# Patient Record
Sex: Female | Born: 1980 | Hispanic: Yes | Marital: Single | Smoking: Never smoker
Health system: Southern US, Community
[De-identification: ages and names within clinical notes are randomized; demographics above are authoritative.]

## PROBLEM LIST (undated history)

## (undated) HISTORY — PX: NOSE SURGERY: SHX723

---

## 2006-07-14 ENCOUNTER — Emergency Department (HOSPITAL_COMMUNITY): Admission: EM | Admit: 2006-07-14 | Discharge: 2006-07-14 | Payer: Self-pay | Admitting: Family Medicine

## 2006-08-13 ENCOUNTER — Encounter (INDEPENDENT_AMBULATORY_CARE_PROVIDER_SITE_OTHER): Payer: Self-pay | Admitting: Family Medicine

## 2006-08-13 ENCOUNTER — Ambulatory Visit: Payer: Self-pay | Admitting: Family Medicine

## 2006-08-13 LAB — CONVERTED CEMR LAB
Basophils Relative: 0 % (ref 0–1)
Eosinophils Absolute: 0.3 10*3/uL (ref 0.0–0.7)
Hepatitis B Surface Ag: NEGATIVE
MCHC: 34.2 g/dL (ref 30.0–36.0)
MCV: 88.8 fL (ref 78.0–100.0)
Neutrophils Relative %: 69 % (ref 43–77)
Platelets: 308 10*3/uL (ref 150–400)

## 2006-08-14 ENCOUNTER — Encounter (INDEPENDENT_AMBULATORY_CARE_PROVIDER_SITE_OTHER): Payer: Self-pay | Admitting: Family Medicine

## 2006-08-14 LAB — CONVERTED CEMR LAB

## 2006-08-20 ENCOUNTER — Encounter: Payer: Self-pay | Admitting: *Deleted

## 2006-08-20 ENCOUNTER — Encounter (INDEPENDENT_AMBULATORY_CARE_PROVIDER_SITE_OTHER): Payer: Self-pay | Admitting: Family Medicine

## 2006-08-20 ENCOUNTER — Encounter (INDEPENDENT_AMBULATORY_CARE_PROVIDER_SITE_OTHER): Payer: Self-pay | Admitting: *Deleted

## 2006-08-20 ENCOUNTER — Ambulatory Visit: Payer: Self-pay | Admitting: Family Medicine

## 2006-08-20 ENCOUNTER — Encounter: Payer: Self-pay | Admitting: Family Medicine

## 2006-08-20 DIAGNOSIS — O99891 Other specified diseases and conditions complicating pregnancy: Secondary | ICD-10-CM | POA: Insufficient documentation

## 2006-08-20 DIAGNOSIS — R8271 Bacteriuria: Secondary | ICD-10-CM

## 2006-08-20 DIAGNOSIS — O9989 Other specified diseases and conditions complicating pregnancy, childbirth and the puerperium: Secondary | ICD-10-CM

## 2006-08-20 DIAGNOSIS — R87610 Atypical squamous cells of undetermined significance on cytologic smear of cervix (ASC-US): Secondary | ICD-10-CM

## 2006-08-20 LAB — CONVERTED CEMR LAB
Chlamydia, DNA Probe: NEGATIVE
GC Probe Amp, Genital: NEGATIVE
Glucose, Urine, Semiquant: NEGATIVE

## 2006-08-21 ENCOUNTER — Encounter (INDEPENDENT_AMBULATORY_CARE_PROVIDER_SITE_OTHER): Payer: Self-pay | Admitting: Family Medicine

## 2006-09-02 ENCOUNTER — Ambulatory Visit: Payer: Self-pay | Admitting: Family Medicine

## 2006-09-02 ENCOUNTER — Encounter: Payer: Self-pay | Admitting: Family Medicine

## 2006-09-10 ENCOUNTER — Ambulatory Visit (HOSPITAL_COMMUNITY): Admission: RE | Admit: 2006-09-10 | Discharge: 2006-09-10 | Payer: Self-pay | Admitting: Family Medicine

## 2006-09-12 ENCOUNTER — Encounter (INDEPENDENT_AMBULATORY_CARE_PROVIDER_SITE_OTHER): Payer: Self-pay | Admitting: Family Medicine

## 2006-10-09 ENCOUNTER — Ambulatory Visit: Payer: Self-pay | Admitting: Family Medicine

## 2006-10-09 ENCOUNTER — Encounter: Payer: Self-pay | Admitting: Family Medicine

## 2006-10-09 DIAGNOSIS — K047 Periapical abscess without sinus: Secondary | ICD-10-CM

## 2006-10-09 LAB — CONVERTED CEMR LAB: Glucose, Urine, Semiquant: NEGATIVE

## 2006-11-10 ENCOUNTER — Encounter: Payer: Self-pay | Admitting: Family Medicine

## 2006-11-10 ENCOUNTER — Ambulatory Visit: Payer: Self-pay | Admitting: Family Medicine

## 2006-11-10 DIAGNOSIS — R7309 Other abnormal glucose: Secondary | ICD-10-CM

## 2006-11-10 LAB — CONVERTED CEMR LAB
Glucose, Urine, Semiquant: NEGATIVE
Protein, U semiquant: NEGATIVE

## 2006-11-14 ENCOUNTER — Ambulatory Visit: Payer: Self-pay | Admitting: Internal Medicine

## 2006-11-14 ENCOUNTER — Encounter (INDEPENDENT_AMBULATORY_CARE_PROVIDER_SITE_OTHER): Payer: Self-pay | Admitting: Family Medicine

## 2006-11-25 ENCOUNTER — Encounter: Payer: Self-pay | Admitting: Family Medicine

## 2006-11-25 ENCOUNTER — Ambulatory Visit: Payer: Self-pay | Admitting: Family Medicine

## 2006-11-25 LAB — CONVERTED CEMR LAB
Glucose, Urine, Semiquant: NEGATIVE
Protein, U semiquant: NEGATIVE

## 2006-12-15 ENCOUNTER — Ambulatory Visit: Payer: Self-pay | Admitting: Family Medicine

## 2006-12-15 ENCOUNTER — Encounter: Payer: Self-pay | Admitting: Family Medicine

## 2006-12-16 ENCOUNTER — Encounter (INDEPENDENT_AMBULATORY_CARE_PROVIDER_SITE_OTHER): Payer: Self-pay | Admitting: Family Medicine

## 2007-01-06 ENCOUNTER — Encounter: Payer: Self-pay | Admitting: Family Medicine

## 2007-01-06 ENCOUNTER — Ambulatory Visit: Payer: Self-pay | Admitting: Family Medicine

## 2007-01-06 LAB — CONVERTED CEMR LAB
Chlamydia, DNA Probe: NEGATIVE
GC Probe Amp, Genital: NEGATIVE
Glucose, Urine, Semiquant: NEGATIVE

## 2007-01-07 ENCOUNTER — Encounter (INDEPENDENT_AMBULATORY_CARE_PROVIDER_SITE_OTHER): Payer: Self-pay | Admitting: Family Medicine

## 2007-01-14 ENCOUNTER — Encounter: Payer: Self-pay | Admitting: Family Medicine

## 2007-01-14 ENCOUNTER — Ambulatory Visit: Payer: Self-pay | Admitting: Family Medicine

## 2007-01-26 ENCOUNTER — Encounter: Payer: Self-pay | Admitting: *Deleted

## 2007-02-01 ENCOUNTER — Inpatient Hospital Stay (HOSPITAL_COMMUNITY): Admission: AD | Admit: 2007-02-01 | Discharge: 2007-02-02 | Payer: Self-pay | Admitting: Obstetrics and Gynecology

## 2007-02-01 ENCOUNTER — Ambulatory Visit: Payer: Self-pay | Admitting: *Deleted

## 2007-02-02 ENCOUNTER — Telehealth: Payer: Self-pay | Admitting: *Deleted

## 2007-02-02 ENCOUNTER — Observation Stay (HOSPITAL_COMMUNITY): Admission: AD | Admit: 2007-02-02 | Discharge: 2007-02-02 | Payer: Self-pay | Admitting: Family Medicine

## 2007-02-03 ENCOUNTER — Inpatient Hospital Stay (HOSPITAL_COMMUNITY): Admission: AD | Admit: 2007-02-03 | Discharge: 2007-02-05 | Payer: Self-pay | Admitting: Family Medicine

## 2007-02-03 ENCOUNTER — Ambulatory Visit: Payer: Self-pay | Admitting: *Deleted

## 2008-08-25 ENCOUNTER — Ambulatory Visit (HOSPITAL_COMMUNITY): Admission: EM | Admit: 2008-08-25 | Discharge: 2008-08-25 | Payer: Self-pay | Admitting: Emergency Medicine

## 2010-05-07 ENCOUNTER — Other Ambulatory Visit: Payer: Self-pay

## 2010-05-07 DIAGNOSIS — Z8781 Personal history of (healed) traumatic fracture: Secondary | ICD-10-CM

## 2010-05-10 ENCOUNTER — Ambulatory Visit
Admission: RE | Admit: 2010-05-10 | Discharge: 2010-05-10 | Disposition: A | Discharge status: Home / Self Care | Payer: Worker's Compensation | Source: Ambulatory Visit

## 2010-05-10 DIAGNOSIS — Z8781 Personal history of (healed) traumatic fracture: Secondary | ICD-10-CM
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
## 2010-10-18 LAB — CBC
MCHC: 34.2
MCV: 86
Platelets: 177
Platelets: 237
RDW: 14.4
RDW: 14.6
WBC: 15.8 — ABNORMAL HIGH

## 2010-10-18 LAB — URINALYSIS, ROUTINE W REFLEX MICROSCOPIC
Bilirubin Urine: NEGATIVE
Glucose, UA: NEGATIVE
Hgb urine dipstick: NEGATIVE
Ketones, ur: NEGATIVE
Protein, ur: NEGATIVE

## 2010-10-18 LAB — URINE CULTURE
Colony Count: NO GROWTH
Culture: NO GROWTH

## 2010-10-18 LAB — URINE MICROSCOPIC-ADD ON

## 2010-10-18 LAB — RPR: RPR Ser Ql: NONREACTIVE

## 2010-11-14 LAB — POCT URINALYSIS DIP (DEVICE)
Glucose, UA: NEGATIVE
Nitrite: NEGATIVE
Operator id: 235561
Protein, ur: NEGATIVE
Urobilinogen, UA: 0.2

## 2010-11-14 LAB — POCT PREGNANCY, URINE: Preg Test, Ur: POSITIVE

## 2013-02-22 IMAGING — CT CT MAXILLOFACIAL W/O CM
3 of 6 series · 16 of 47 positions shown, 19 images · non-contrast
Comparison: 08/25/2008

CLINICAL DATA: Trauma.  Nasal fractures.

CT MAXILLOFACIAL WITHOUT CONTRAST
TECHNIQUE: Multidetector CT imaging of the maxillofacial
structures was performed. Multiplanar CT image reconstructions were
also generated.

[Series 3: max bone · axial · 0.31mm/px · z∈[-88,+44]mm · 12 of 63 slices shown, 15 images]
[im 5/63  brain]
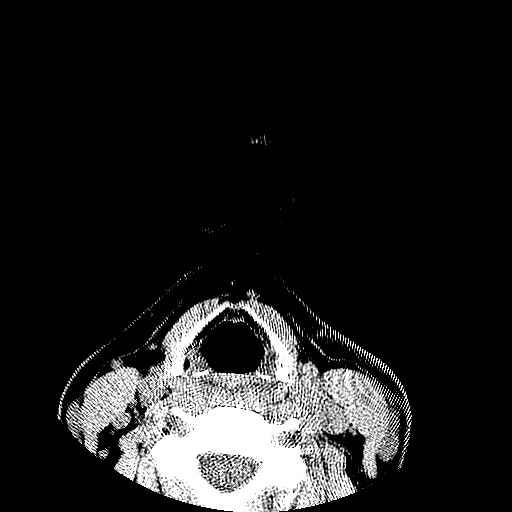
[im 5/63  bone]
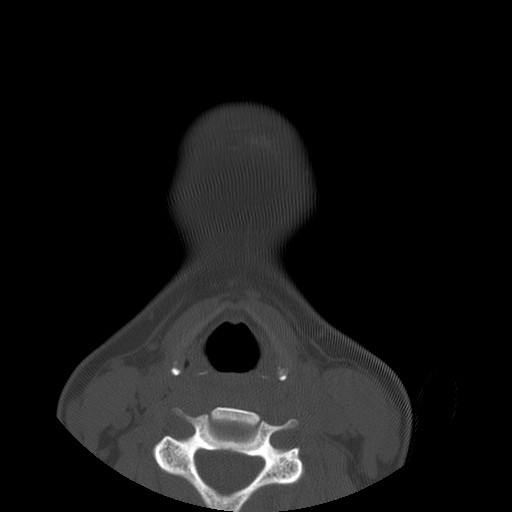
[im 9/63  bone]
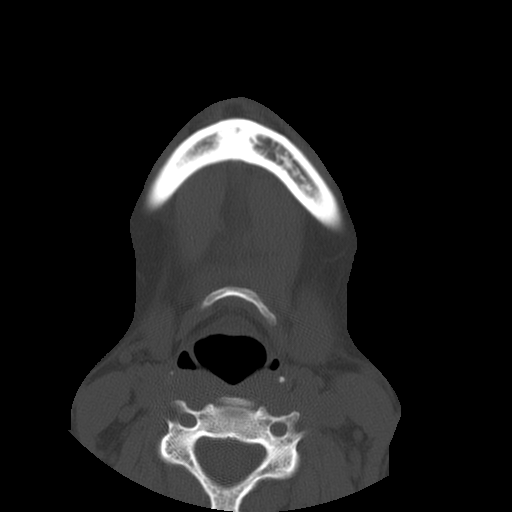
[im 14/63  bone]
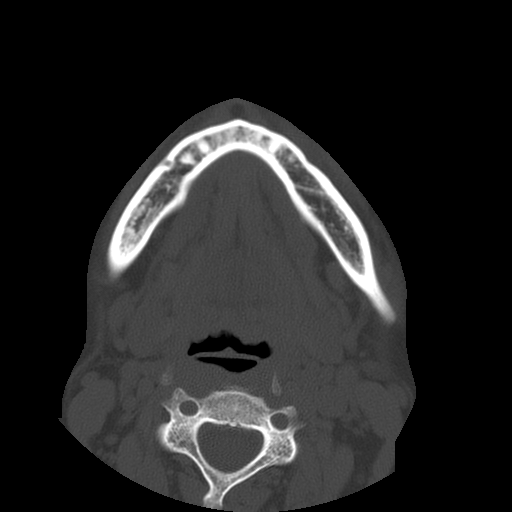
[im 18/63  bone]
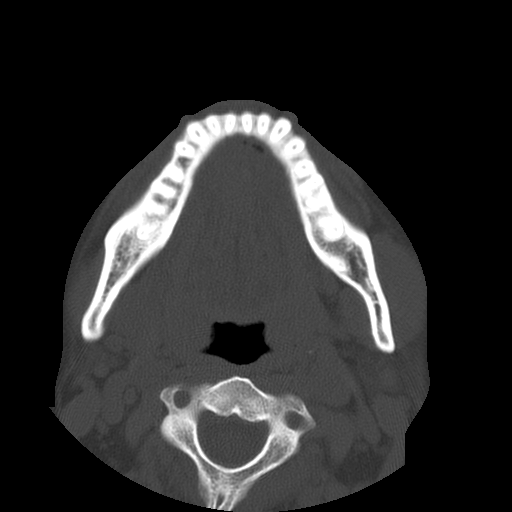
[im 23/63  brain]
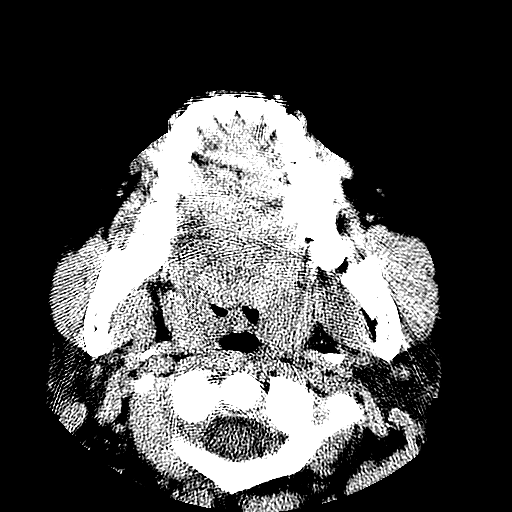
[im 23/63  bone]
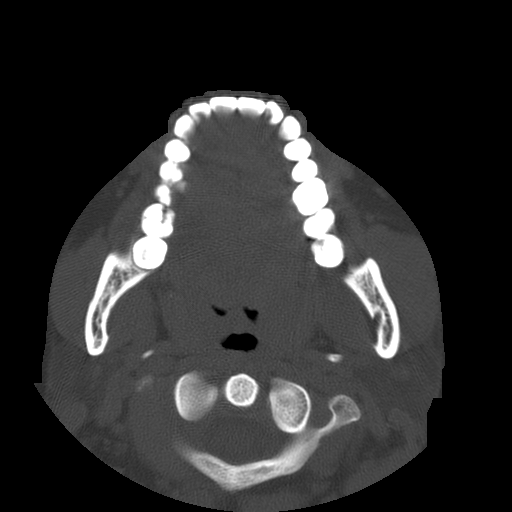
[im 27/63  bone]
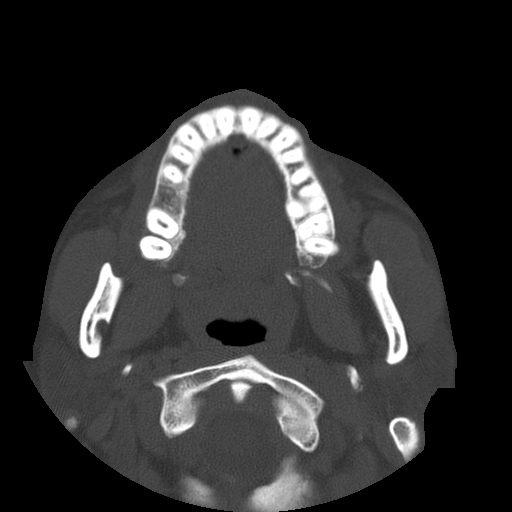
[im 36/63  bone]
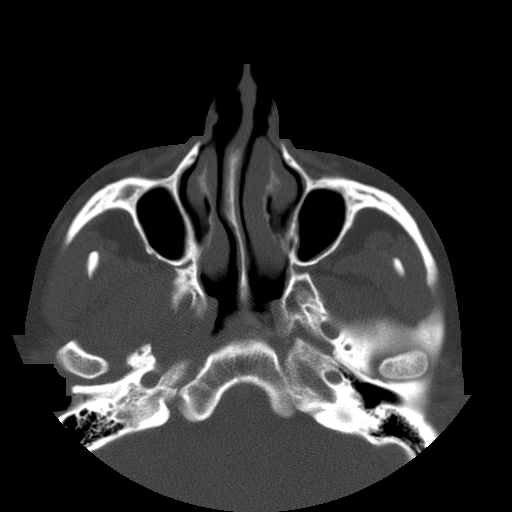
[im 40/63  bone]
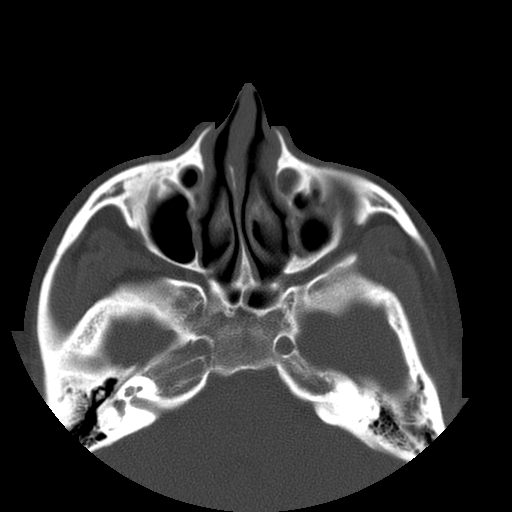
[im 45/63  brain]
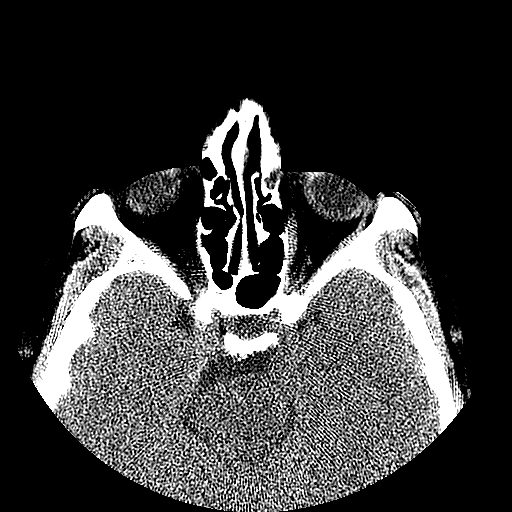
[im 45/63  bone]
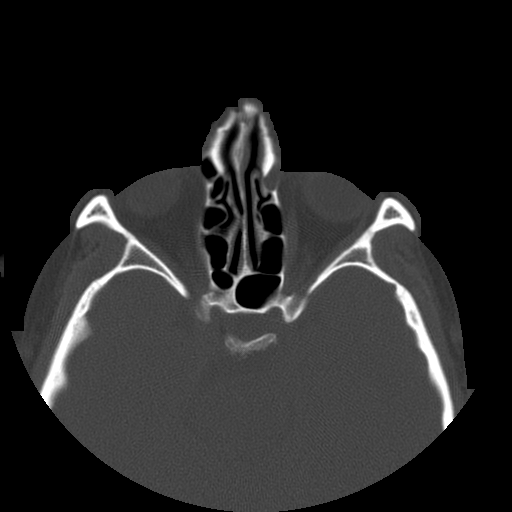
[im 49/63  bone]
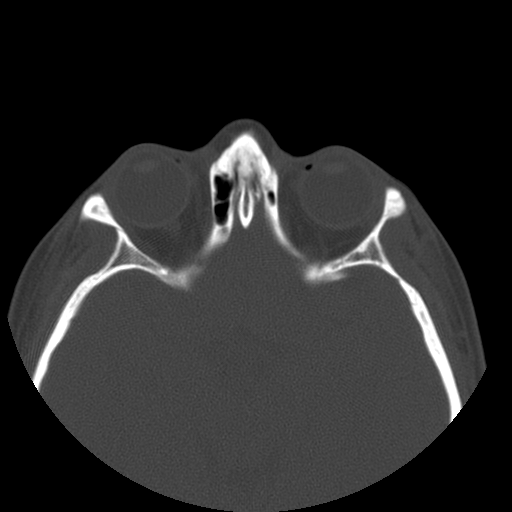
[im 54/63  bone]
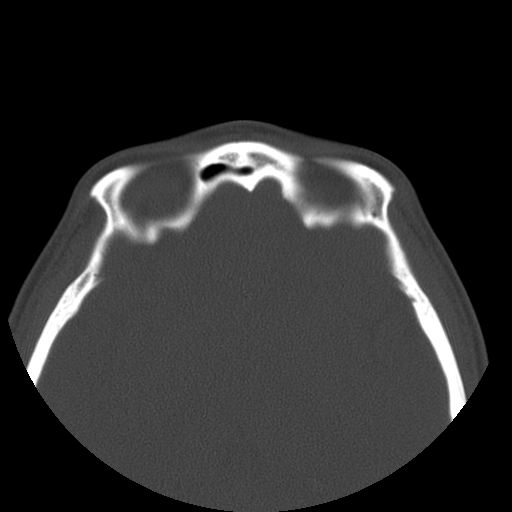
[im 58/63  bone]
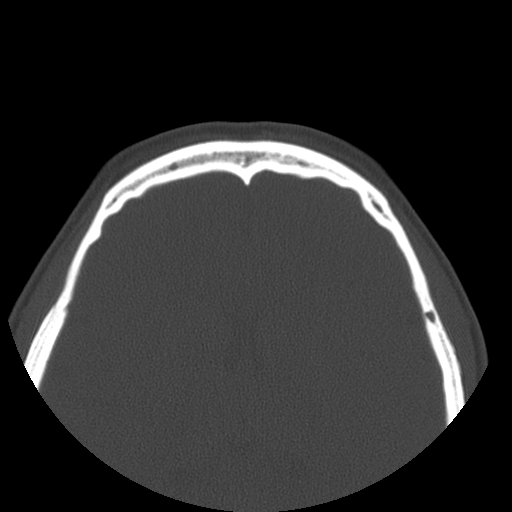

[Series 103: cor bone · sagittal · 0.31mm/px · 3 of 63 slices shown]
[im 16/63  bone]
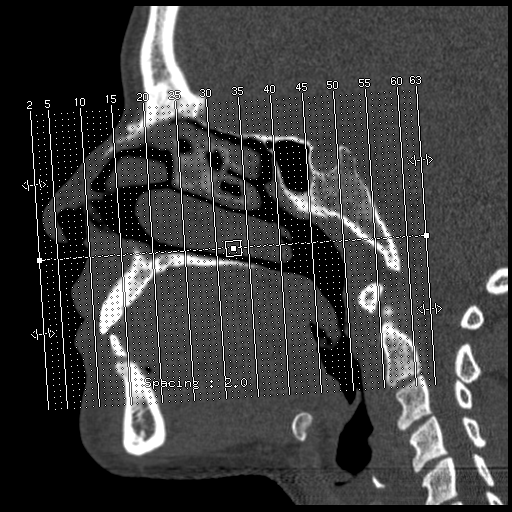
[im 32/63  bone]
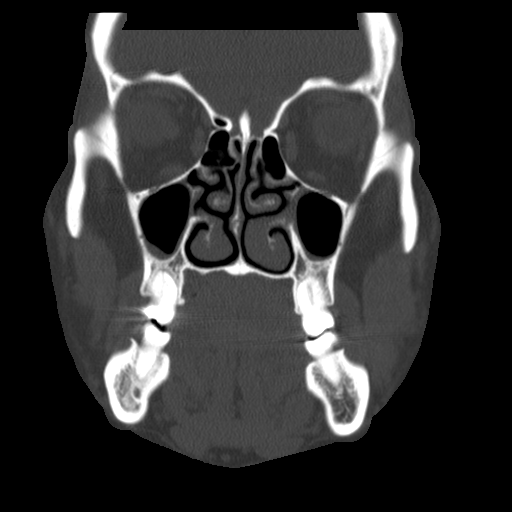
[im 47/63  bone]
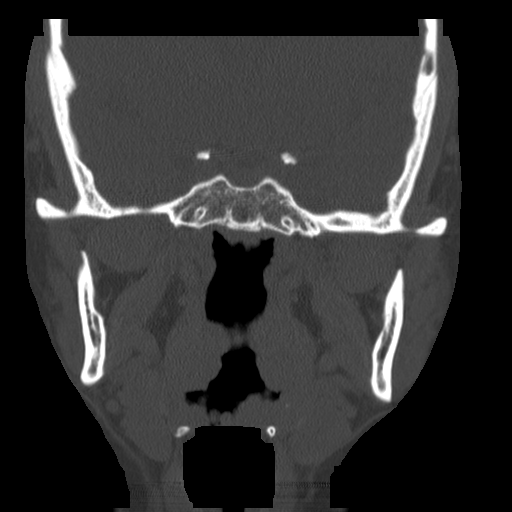

[Series 104: sag bone · sagittal · 0.31mm/px · 1 of 72 slices shown]
[im 36/72  bone]
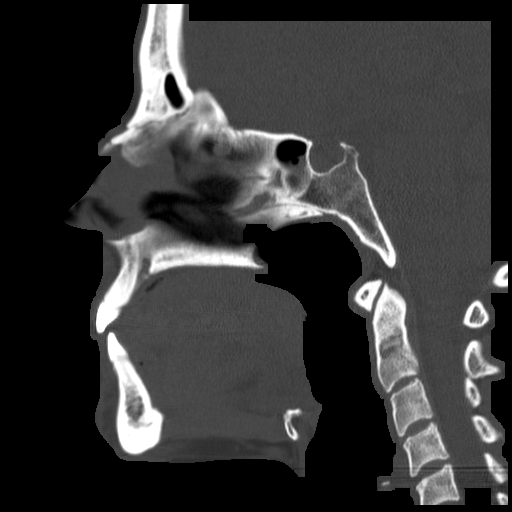

[16 of 47 positions shown; findings below may reference images not displayed]

FINDINGS: On the previous study, there were acute comminuted
fractures of the nasal bones.  These were surgically repaired.
Position and alignment of the fragments is markedly improved
compared to that acute examination, but it does appear that there
is not complete union of all fragments.  Fracture of the nasal
septum has healed and remodeled.  No new abnormalities are seen.
No fluid in the sinuses.

3-D reconstructions were done at the workstation by myself.
IMPRESSION: Improved position and alignment of multiple comminuted nasal bone
fractures when compared to the acute examination of 08/25/2008.
There does not appear to be , complete union throughout the region
however.

Healing and remodeling of the anterior nasal septum fracture.

## 2015-01-29 NOTE — L&D Delivery Note (Signed)
Delivery Note At 1:49 PM a viable female was delivered via Vaginal, Spontaneous Delivery (Presentation: vertex, LOA).  APGAR: 9, 9;   Placenta status: delivered spontaneously intact.   Cord: 3 vessel without complication.  Anesthesia:  Epidural Episiotomy:  No Lacerations:  1st degree perineal Suture Repair: No surgical repair necessary, hemostasis with pressure Est. Blood Loss (mL): 250 ml   Mom to postpartum.  Baby to Couplet care / Skin to Skin.  Andres Ege, MD, PGY-1, MPH 08/25/2015, 2:15 PM  Patient is a [id number] at [redacted]w[redacted]d who was admitted for IOL due to FHR variables, uncomplicated prenatal course.  She progressed with augmentation via cytotec/FB/Pit/AROM.  I was gloved and present for delivery in its entirety.  Second stage of labor progressed, baby delivered after a few contractions.  Mild decels during second stage noted.  Complications: none  Lacerations: none  EBL: 250cc  Cam Hai, CNM 6:54 PM  08/25/2015

## 2015-04-17 LAB — OB RESULTS CONSOLE RUBELLA ANTIBODY, IGM: RUBELLA: IMMUNE

## 2015-04-17 LAB — OB RESULTS CONSOLE RPR: RPR: NONREACTIVE

## 2015-04-17 LAB — OB RESULTS CONSOLE ANTIBODY SCREEN: ANTIBODY SCREEN: NEGATIVE

## 2015-04-17 LAB — OB RESULTS CONSOLE ABO/RH: RH Type: POSITIVE

## 2015-04-17 LAB — OB RESULTS CONSOLE HEPATITIS B SURFACE ANTIGEN: Hepatitis B Surface Ag: NEGATIVE

## 2015-04-17 LAB — OB RESULTS CONSOLE HIV ANTIBODY (ROUTINE TESTING): HIV: NONREACTIVE

## 2015-04-17 LAB — OB RESULTS CONSOLE GC/CHLAMYDIA
CHLAMYDIA, DNA PROBE: NEGATIVE
Gonorrhea: NEGATIVE

## 2015-08-02 LAB — OB RESULTS CONSOLE GBS: GBS: NEGATIVE

## 2015-08-02 LAB — OB RESULTS CONSOLE GC/CHLAMYDIA
Chlamydia: NEGATIVE
Gonorrhea: NEGATIVE

## 2015-08-22 ENCOUNTER — Encounter (HOSPITAL_COMMUNITY): Payer: Self-pay | Admitting: *Deleted

## 2015-08-22 ENCOUNTER — Telehealth (HOSPITAL_COMMUNITY): Payer: Self-pay | Admitting: *Deleted

## 2015-08-22 NOTE — Telephone Encounter (Signed)
Interpreter number [phone]

## 2015-08-24 ENCOUNTER — Encounter (HOSPITAL_COMMUNITY): Payer: Self-pay | Admitting: *Deleted

## 2015-08-24 ENCOUNTER — Inpatient Hospital Stay (HOSPITAL_COMMUNITY)
Admission: AD | Admit: 2015-08-24 | Discharge: 2015-08-27 | DRG: 767 | Disposition: A | Discharge status: Home / Self Care | Payer: No Typology Code available for payment source | Source: Ambulatory Visit | Attending: Obstetrics and Gynecology | Admitting: Obstetrics and Gynecology

## 2015-08-24 DIAGNOSIS — O480 Post-term pregnancy: Secondary | ICD-10-CM | POA: Diagnosis present

## 2015-08-24 DIAGNOSIS — Z3A40 40 weeks gestation of pregnancy: Secondary | ICD-10-CM

## 2015-08-24 DIAGNOSIS — Z302 Encounter for sterilization: Secondary | ICD-10-CM | POA: Diagnosis not present

## 2015-08-24 DIAGNOSIS — O368390 Maternal care for abnormalities of the fetal heart rate or rhythm, unspecified trimester, not applicable or unspecified: Secondary | ICD-10-CM | POA: Diagnosis present

## 2015-08-24 LAB — CBC
HCT: 37.6 % (ref 36.0–46.0)
HEMOGLOBIN: 12.8 g/dL (ref 12.0–15.0)
MCH: 29.9 pg (ref 26.0–34.0)
MCHC: 34 g/dL (ref 30.0–36.0)
MCV: 87.9 fL (ref 78.0–100.0)
PLATELETS: 204 10*3/uL (ref 150–400)
RBC: 4.28 MIL/uL (ref 3.87–5.11)
RDW: 14.3 % (ref 11.5–15.5)
WBC: 9.3 10*3/uL (ref 4.0–10.5)

## 2015-08-24 LAB — ABO/RH: ABO/RH(D): O POS

## 2015-08-24 MED ORDER — LACTATED RINGERS IV SOLN
INTRAVENOUS | Status: DC
  Administered 2015-08-24 – 2015-08-25 (×3): via INTRAVENOUS

## 2015-08-24 MED ORDER — OXYCODONE-ACETAMINOPHEN 5-325 MG PO TABS: 1.0000 | ORAL_TABLET | ORAL | Status: DC | PRN

## 2015-08-24 MED ORDER — OXYCODONE-ACETAMINOPHEN 5-325 MG PO TABS
2.0000 | ORAL_TABLET | ORAL | Status: DC | PRN
Start: 2015-08-24 — End: 2015-08-25

## 2015-08-24 MED ORDER — SOD CITRATE-CITRIC ACID 500-334 MG/5ML PO SOLN: 30.0000 mL | ORAL | Status: DC | PRN

## 2015-08-24 MED ORDER — OXYTOCIN 40 UNITS IN LACTATED RINGERS INFUSION - SIMPLE MED: 2.5000 [IU]/h | INTRAVENOUS | Status: DC

## 2015-08-24 MED ORDER — LACTATED RINGERS IV SOLN
500.0000 mL | INTRAVENOUS | Status: DC | PRN
  Administered 2015-08-24: 500 mL via INTRAVENOUS

## 2015-08-24 MED ORDER — OXYTOCIN BOLUS FROM INFUSION
500.0000 mL | Freq: Once | INTRAVENOUS | Status: AC
  Administered 2015-08-25: 500 mL/h via INTRAVENOUS

## 2015-08-24 MED ORDER — MISOPROSTOL 25 MCG QUARTER TABLET
25.0000 ug | ORAL_TABLET | ORAL | Status: DC
  Administered 2015-08-24 – 2015-08-25 (×2): 25 ug via VAGINAL
  Filled 2015-08-24 (×4): qty 1
  Filled 2015-08-24 (×2): qty 0.25
  Filled 2015-08-24 (×2): qty 1
  Filled 2015-08-24: qty 0.25

## 2015-08-24 MED ORDER — LIDOCAINE HCL (PF) 1 % IJ SOLN
30.0000 mL | INTRAMUSCULAR | Status: DC | PRN
  Filled 2015-08-24: qty 30

## 2015-08-24 MED ORDER — ACETAMINOPHEN 325 MG PO TABS: 650.0000 mg | ORAL_TABLET | ORAL | Status: DC | PRN

## 2015-08-24 MED ORDER — ONDANSETRON HCL 4 MG/2ML IJ SOLN: 4.0000 mg | Freq: Four times a day (QID) | INTRAMUSCULAR | Status: DC | PRN

## 2015-08-24 MED ORDER — FLEET ENEMA 7-19 GM/118ML RE ENEM: 1.0000 | ENEMA | RECTAL | Status: DC | PRN

## 2015-08-24 MED ORDER — MISOPROSTOL 200 MCG PO TABS
50.0000 ug | ORAL_TABLET | Freq: Once | ORAL | Status: AC
  Administered 2015-08-24: 50 ug via ORAL
  Filled 2015-08-24: qty 1

## 2015-08-24 NOTE — Progress Notes (Signed)
Patient ID: Jeanne Hammond, female   DOB: [birth year], 35 y.o.   MRN: [id number]   Jeanne Hammond is a 35 y.o. G3P2 at [redacted]w[redacted]d  admitted for induction of labor due to variables at term.  Subjective:  No c/o.  Not in any discomfort Objective: Vitals:   08/24/15 1604 08/24/15 1608 08/24/15 1942 08/24/15 2036  BP:  (!) 100/58    Pulse:  67    Resp:  16 17 17   Temp:  98.4 F (36.9 C)    TempSrc:  Oral    SpO2:      Weight: 69.4 kg (153 lb)     Height: 5\' 5"  (1.651 m)      No intake/output data recorded.  FHT:  FHR: 145 bpm, variability: moderate,  accelerations:  Present,  decelerations:  Present occasional mild variable UC:   irregular, every 3-10 minutes SVE:   Dilation: Closed Effacement (%): Thick Station: Ballotable Exam by:: Violeta Gelinas, RN  2nd cytotec inserted vaginally  Labs: Lab Results  Component Value Date   WBC 9.3 08/24/2015   HGB 12.8 08/24/2015   HCT 37.6 08/24/2015   MCV 87.9 08/24/2015   PLT 204 08/24/2015    Assessment / Plan: IOL for variable decels at term, ripening phase Plan Foley when cx opens up Labor: Progressing normally Fetal Wellbeing:  Category I Pain Control:  Labor support without medications Anticipated MOD:  NSVD  CRESENZO-DISHMAN,Jeanne Hammond 08/24/2015, 9:13 PM

## 2015-08-24 NOTE — Progress Notes (Signed)
Report to [place], BS charge RN.  Waiting for call back for room number.

## 2015-08-24 NOTE — MAU Provider Note (Signed)
  History     CSN: [id number]  Arrival date and time: 08/24/15 8502   First Provider Initiated Contact with Patient 08/24/15 1050      Chief Complaint  Patient presents with  . sent over from HD, variables on NST   HPI   Ms.Jeanne Hammond is a 35 y.o. female G3P2 @ [redacted]w[redacted]d sent here from the HD after NST revealed recurrent variable decelerations. She had an 8/8 BPP.  Low risk pregnancy + fetal movement Denies leaking of fluid or vaginal bleeding   OB History    Gravida Para Term Preterm AB Living   3 2       2    SAB TAB Ectopic Multiple Live Births                  History reviewed. No pertinent past medical history.  Past Surgical History:  Procedure Laterality Date  . NOSE SURGERY      History reviewed. No pertinent family history.  Social History  Substance Use Topics  . Smoking status: Never Smoker  . Smokeless tobacco: Never Used  . Alcohol use No    Allergies: No Known Allergies  Prescriptions Prior to Admission  Medication Sig Dispense Refill Last Dose  . Prenatal Vit-Fe Fumarate-FA (PRENATAL MULTIVITAMIN) TABS tablet Take 1 tablet by mouth daily at 12 noon.   08/23/2015 at Unknown time   No results found for this or any previous visit (from the past 48 hour(s)).  Review of Systems  Constitutional: Negative for chills and fever.  Gastrointestinal: Positive for abdominal pain (Contraction pain. ).   Physical Exam   Blood pressure 106/72, pulse 91, temperature 98 F (36.7 C), temperature source Oral, resp. rate 16, weight 153 lb 4.8 oz (69.5 kg), SpO2 99 %.  Physical Exam  Constitutional: She is oriented to person, place, and time. She appears well-developed and well-nourished. No distress.  HENT:  Head: Normocephalic.  Eyes: Pupils are equal, round, and reactive to light.  Genitourinary:  Genitourinary Comments: Dilation: Fingertip Effacement (%): Thick Cervical Position: Posterior Station: Ballotable Presentation: Vertex Exam by:: Leafy Ro,  RNC  Musculoskeletal: Normal range of motion.  Neurological: She is alert and oriented to person, place, and time.  Skin: Skin is warm. She is not diaphoretic.  Psychiatric: Her behavior is normal.   Fetal Tracing: Baseline: 135 bpm  Variability: moderate  Accelerations:  15x15 Decelerations: recurrent variable decelerations. Good recovery to baseline.  Toco: Irregular contraction pattern   MAU Course  Procedures  None  MDM  Discussed patient with Dr. Jolayne Panther Will admit to Labor and Delivery.   Assessment and Plan   A:  IOL Variable decelerations GBS negative  Category 2 fetal tracing    P:  Admit to labor and delivery Cytotec 50 mcg PO   Duane Lope, NP 08/24/2015 11:24 AM

## 2015-08-24 NOTE — Anesthesia Pain Management Evaluation Note (Signed)
  CRNA Pain Management Visit Note  Patient: Jeanne Hammond, 35 y.o., female  "Hello I am a member of the anesthesia team at [facility]. We have an anesthesia team available at all times to provide care throughout the hospital, including epidural management and anesthesia for C-section. I don't know your plan for the delivery whether it a natural birth, water birth, IV sedation, nitrous supplementation, doula or epidural, but we want to meet your pain goals."   1.Was your pain managed to your expectations on prior hospitalizations?   Yes   2.What is your expectation for pain management during this hospitalization?     IV pain meds  3.How can we help you reach that goal? Be available as needed  Record the patient's initial score and the patient's pain goal.   Pain: 3  Pain Goal: 8 [facility] wants you to be able to say your pain was always managed very well.  [facility] 08/24/2015

## 2015-08-24 NOTE — H&P (Signed)
Jeanne Hammond is a 35 y.o. female G3P2 @[redacted]w[redacted]d  sent from [facility] with nonreassuring FHR/variable decelerations.  IOL for nonreassuring FHR at 40 weeks.  She reports good fetal movement, denies LOF, vaginal bleeding, vaginal itching/burning, urinary symptoms, h/a, dizziness, n/v, or fever/chills.     OB History    Gravida Para Term Preterm AB Living   3 2       2    SAB TAB Ectopic Multiple Live Births                 History reviewed. No pertinent past medical history. Past Surgical History:  Procedure Laterality Date  . NOSE SURGERY     Family History: family history includes Cancer in her sister. Social History:  reports that she has never smoked. She has never used smokeless tobacco. She reports that she does not drink alcohol or use drugs.     Maternal Diabetes: No Genetic Screening: Declined Maternal Ultrasounds/Referrals: Normal Fetal Ultrasounds or other Referrals:  None Maternal Substance Abuse:  No Significant Maternal Medications:  None Significant Maternal Lab Results:  Lab values include: Group B Strep negative Other Comments:  None  Review of Systems  Constitutional: Negative for chills, fever and malaise/fatigue.  Eyes: Negative for blurred vision.  Respiratory: Negative for cough and shortness of breath.   Cardiovascular: Negative for chest pain.  Gastrointestinal: Negative for abdominal pain, heartburn and vomiting.  Genitourinary: Negative for dysuria, frequency and urgency.  Musculoskeletal: Negative.   Neurological: Negative for dizziness and headaches.  Psychiatric/Behavioral: Negative for depression.   Maternal Medical History:  Reason for admission: nonreassurring fetal surveillance  Contractions: Frequency: irregular.   Perceived severity is mild.    Fetal activity: Perceived fetal activity is normal.   Last perceived fetal movement was within the past hour.    Prenatal complications: no prenatal complications Prenatal Complications - Diabetes:  none.    Dilation: Fingertip Effacement (%): Thick Station: Ballotable Exam by:: Leafy Ro, RNC Blood pressure (!) 106/54, pulse 60, temperature 98.5 F (36.9 C), temperature source Oral, resp. rate 17, weight 69.5 kg (153 lb 4.8 oz), SpO2 99 %. Maternal Exam:  Uterine Assessment: Contraction strength is mild.  Contraction frequency is rare.   Abdomen: Fetal presentation: vertex     Fetal Exam Fetal Monitor Review: Mode: ultrasound.   Baseline rate: 135.  Variability: moderate (6-25 bpm).   Pattern: accelerations present and variable decelerations.    Fetal State Assessment: Category II - tracings are indeterminate.     Physical Exam  Nursing note and vitals reviewed. Constitutional: She is oriented to person, place, and time. She appears well-developed and well-nourished.  Neck: Normal range of motion.  Cardiovascular: Normal rate, regular rhythm and normal heart sounds.   Respiratory: Effort normal.  GI: Soft.  Musculoskeletal: Normal range of motion.  Neurological: She is alert and oriented to person, place, and time. She has normal reflexes.  Skin: Skin is warm and dry.  Psychiatric: She has a normal mood and affect. Her behavior is normal. Judgment and thought content normal.    Prenatal labs: ABO, Rh: --/--/O POS (07/27 1045) Antibody: NEG (07/27 1045) Rubella: Immune (03/20 0000) RPR: Nonreactive (03/20 0000)  HBsAg: Negative (03/20 0000)  HIV: Non-reactive (03/20 0000)  GBS: Negative (07/05 0000)   Assessment/Plan: [id number] @[redacted]w[redacted]d  by LMP 1. Variable fetal heart rate decelerations, antepartum   2. Post term pregnancy over 40 weeks     Admit to [facility] for IOL Cytotec 50 mcg PO x 1 dose, consider foley  bulb then Pitocin Desires BTL and has paperwork from her job that indicates they will pay for BTL Anticipate NSVD   LEFTWICH-KIRBY, Faduma Cho 08/24/2015, 2:27 PM

## 2015-08-25 ENCOUNTER — Inpatient Hospital Stay (HOSPITAL_COMMUNITY): Payer: No Typology Code available for payment source | Admitting: Anesthesiology

## 2015-08-25 ENCOUNTER — Encounter (HOSPITAL_COMMUNITY): Payer: Self-pay

## 2015-08-25 DIAGNOSIS — O480 Post-term pregnancy: Secondary | ICD-10-CM

## 2015-08-25 DIAGNOSIS — Z3A40 40 weeks gestation of pregnancy: Secondary | ICD-10-CM

## 2015-08-25 LAB — TYPE AND SCREEN
ABO/RH(D): O POS
ANTIBODY SCREEN: NEGATIVE

## 2015-08-25 LAB — HIV ANTIBODY (ROUTINE TESTING W REFLEX): HIV SCREEN 4TH GENERATION: NONREACTIVE

## 2015-08-25 LAB — RPR: RPR: NONREACTIVE

## 2015-08-25 MED ORDER — METOCLOPRAMIDE HCL 10 MG PO TABS
10.0000 mg | ORAL_TABLET | Freq: Once | ORAL | Status: AC
  Administered 2015-08-26: 10 mg via ORAL
  Filled 2015-08-25: qty 1

## 2015-08-25 MED ORDER — PHENYLEPHRINE 40 MCG/ML (10ML) SYRINGE FOR IV PUSH (FOR BLOOD PRESSURE SUPPORT)
80.0000 ug | PREFILLED_SYRINGE | INTRAVENOUS | Status: DC | PRN
  Filled 2015-08-25: qty 5
  Filled 2015-08-25: qty 10

## 2015-08-25 MED ORDER — LACTATED RINGERS IV SOLN
INTRAVENOUS | Status: DC
  Administered 2015-08-26: 10 mL/h via INTRAVENOUS

## 2015-08-25 MED ORDER — COCONUT OIL OIL: 1.0000 "application " | TOPICAL_OIL | Status: DC | PRN

## 2015-08-25 MED ORDER — ONDANSETRON HCL 4 MG PO TABS: 4.0000 mg | ORAL_TABLET | ORAL | Status: DC | PRN

## 2015-08-25 MED ORDER — DIPHENHYDRAMINE HCL 50 MG/ML IJ SOLN: 12.5000 mg | INTRAMUSCULAR | Status: DC | PRN

## 2015-08-25 MED ORDER — PRENATAL MULTIVITAMIN CH
1.0000 | ORAL_TABLET | Freq: Every day | ORAL | Status: DC
  Administered 2015-08-27: 1 via ORAL
  Filled 2015-08-25: qty 1

## 2015-08-25 MED ORDER — TETANUS-DIPHTH-ACELL PERTUSSIS 5-2.5-18.5 LF-MCG/0.5 IM SUSP: 0.5000 mL | Freq: Once | INTRAMUSCULAR | Status: DC

## 2015-08-25 MED ORDER — WITCH HAZEL-GLYCERIN EX PADS: 1.0000 "application " | MEDICATED_PAD | CUTANEOUS | Status: DC | PRN

## 2015-08-25 MED ORDER — ONDANSETRON HCL 4 MG/2ML IJ SOLN: 4.0000 mg | INTRAMUSCULAR | Status: DC | PRN

## 2015-08-25 MED ORDER — ZOLPIDEM TARTRATE 5 MG PO TABS: 5.0000 mg | ORAL_TABLET | Freq: Every evening | ORAL | Status: DC | PRN

## 2015-08-25 MED ORDER — FENTANYL 2.5 MCG/ML BUPIVACAINE 1/10 % EPIDURAL INFUSION (WH - ANES)
14.0000 mL/h | INTRAMUSCULAR | Status: DC | PRN
  Administered 2015-08-25: 14 mL/h via EPIDURAL
  Filled 2015-08-25: qty 125

## 2015-08-25 MED ORDER — LIDOCAINE HCL (PF) 1 % IJ SOLN
INTRAMUSCULAR | Status: DC | PRN
  Administered 2015-08-25: 6 mL via EPIDURAL
  Administered 2015-08-25: 4 mL

## 2015-08-25 MED ORDER — SENNOSIDES-DOCUSATE SODIUM 8.6-50 MG PO TABS
2.0000 | ORAL_TABLET | ORAL | Status: DC
  Administered 2015-08-26 (×2): 2 via ORAL
  Filled 2015-08-25 (×2): qty 2

## 2015-08-25 MED ORDER — ACETAMINOPHEN 325 MG PO TABS
650.0000 mg | ORAL_TABLET | ORAL | Status: DC | PRN
  Administered 2015-08-26 (×2): 650 mg via ORAL
  Filled 2015-08-25 (×2): qty 2

## 2015-08-25 MED ORDER — EPHEDRINE 5 MG/ML INJ
10.0000 mg | INTRAVENOUS | Status: DC | PRN
  Filled 2015-08-25: qty 4

## 2015-08-25 MED ORDER — SIMETHICONE 80 MG PO CHEW: 80.0000 mg | CHEWABLE_TABLET | ORAL | Status: DC | PRN

## 2015-08-25 MED ORDER — TERBUTALINE SULFATE 1 MG/ML IJ SOLN
0.2500 mg | Freq: Once | INTRAMUSCULAR | Status: DC | PRN
  Filled 2015-08-25: qty 1

## 2015-08-25 MED ORDER — OXYTOCIN 40 UNITS IN LACTATED RINGERS INFUSION - SIMPLE MED
1.0000 m[IU]/min | INTRAVENOUS | Status: DC
  Administered 2015-08-25: 2 m[IU]/min via INTRAVENOUS
  Filled 2015-08-25: qty 1000

## 2015-08-25 MED ORDER — DIPHENHYDRAMINE HCL 25 MG PO CAPS: 25.0000 mg | ORAL_CAPSULE | Freq: Four times a day (QID) | ORAL | Status: DC | PRN

## 2015-08-25 MED ORDER — LACTATED RINGERS IV SOLN: 500.0000 mL | Freq: Once | INTRAVENOUS | Status: DC

## 2015-08-25 MED ORDER — BENZOCAINE-MENTHOL 20-0.5 % EX AERO
1.0000 "application " | INHALATION_SPRAY | CUTANEOUS | Status: DC | PRN
  Filled 2015-08-25: qty 56

## 2015-08-25 MED ORDER — PHENYLEPHRINE 40 MCG/ML (10ML) SYRINGE FOR IV PUSH (FOR BLOOD PRESSURE SUPPORT)
80.0000 ug | PREFILLED_SYRINGE | INTRAVENOUS | Status: DC | PRN
  Filled 2015-08-25: qty 5

## 2015-08-25 MED ORDER — DIBUCAINE 1 % RE OINT: 1.0000 "application " | TOPICAL_OINTMENT | RECTAL | Status: DC | PRN

## 2015-08-25 MED ORDER — FAMOTIDINE 20 MG PO TABS
40.0000 mg | ORAL_TABLET | Freq: Once | ORAL | Status: AC
  Administered 2015-08-26: 40 mg via ORAL
  Filled 2015-08-25: qty 2

## 2015-08-25 MED ORDER — IBUPROFEN 600 MG PO TABS
600.0000 mg | ORAL_TABLET | Freq: Four times a day (QID) | ORAL | Status: DC
  Administered 2015-08-25 – 2015-08-27 (×7): 600 mg via ORAL
  Filled 2015-08-25 (×7): qty 1

## 2015-08-25 NOTE — Progress Notes (Signed)
Patient ID: Jeanne Hammond, female   DOB: [birth year], 35 y.o.   MRN: [id number] Patient ID: Jeanne Hammond, female   DOB: 03-16-1980, 35 y.o.   MRN: [id number]   Jeanne Hammond is a 35 y.o. G3P2 at [redacted]w[redacted]d  admitted for induction of labor due to variables at term.  Subjective:  No c/o.  Not in any discomfort Objective: Vitals:   08/24/15 1608 08/24/15 1942 08/24/15 2036 08/24/15 2339  BP: (!) 100/58     Pulse: 67     Resp: 16 17 17 18   Temp: 98.4 F (36.9 C)  98 F (36.7 C)   TempSrc: Oral  Oral   SpO2:      Weight:      Height:       No intake/output data recorded.  FHT:  FHR: 145 bpm, variability: moderate,  accelerations:  Present,  decelerations:  Present occasional mild variable.  Had late decels for 10-15 mintues, responded to IVF bolus, position change.  + scalp stim UC:   irregular, every 3-10 minutes SVE:   Dilation: Closed (Outer Os 1.5cm) Effacement (%): Thick Station: -3 Exam by:: Drenda Freeze C. CNM    undissolved cytotec found near introitus.  Attempted to place foley but could not pass through inner os.  Labs: Lab Results  Component Value Date   WBC 9.3 08/24/2015   HGB 12.8 08/24/2015   HCT 37.6 08/24/2015   MCV 87.9 08/24/2015   PLT 204 08/24/2015    Assessment / Plan: IOL for variable decels at term, ripening phase Plan Foley when cx opens up.  Repeat cytotec if Cat 1 FHR continues for at least 30 minutes Labor: Progressing normally Fetal Wellbeing:  Category I/Category 2 Pain Control:  Labor support without medications Anticipated MOD:  NSVD  CRESENZO-DISHMAN,Jeanne Hammond 08/25/2015, 1:09 AM

## 2015-08-25 NOTE — Anesthesia Preprocedure Evaluation (Signed)

## 2015-08-25 NOTE — Progress Notes (Signed)
Jeanne Hammond is a 35 y.o. G3P2 at [redacted]w[redacted]d admitted for IOL for nonreassuring FHR at 40 weeks  Subjective: Jeanne Hammond is currently resting comfortably with the epidural in place. Some category 2 tracings with vriable decels, AROM and IUPC placed at 11:50am.   Objective: BP 100/62   Pulse 63   Temp 98.2 F (36.8 C) (Oral)   Resp 18   Ht 5\' 5"  (1.651 m)   Wt 153 lb (69.4 kg)   SpO2 99%   BMI 25.46 kg/m  No intake/output data recorded. No intake/output data recorded.  FHT:   Fetal Heart Rate A  Mode External [Wireless monitors replaced, battery died] filed at 09/11/15 0135  Baseline Rate (A) 140 bpm filed at 11-Sep-2015 1156  Variability 6-25 BPM filed at 11-Sep-2015 1156  Accelerations 10 x 10 filed at 09-11-2015 1156  Decelerations Early, Variable filed at 09/11/15 1156   UC:   regular, every 2-4 minutes SVE:   Dilation: 6 Effacement (%): 70 Station: -2 Exam by:: Philipp Deputy CNM  Labs: Lab Results  Component Value Date   WBC 9.3 08/24/2015   HGB 12.8 08/24/2015   HCT 37.6 08/24/2015   MCV 87.9 08/24/2015   PLT 204 08/24/2015    Assessment / Plan: Induction of labor due for nonreassuring FHR at [redacted]w[redacted]d. Foley out, progressing on pitocin 2x2, variable decels present.  Labor: AROM at 11:50, placement of IUPC  Preeclampsia:  NA Fetal Wellbeing:  Category 2 with early & variable decels Pain Control:  Epidural I/D:  GBS neg Anticipated MOD:  NSVD  Andres Ege, MD, PGY-1, MPH Sep 11, 2015, 12:00 PM   I have seen and examined this patient and I agree with the above. Cam Hai 6:53 PM 09/11/2015

## 2015-08-25 NOTE — Anesthesia Procedure Notes (Signed)

## 2015-08-25 NOTE — Progress Notes (Signed)
Delivery of live viable female by Dr Holly Bodily, assisted by Philipp Deputy, CNM

## 2015-08-25 NOTE — Progress Notes (Signed)
I stopped by to check on patient's needs.  Eda H Royal Interpreter. °

## 2015-08-25 NOTE — Lactation Note (Addendum)
This note was copied from a baby's chart. Lactation Consultation Note  Patient Name: Jeanne Hammond [id number] Date: 08/25/2015 Reason for consult: Initial assessment [facility] Interpreter used. Baby at 4 hr of life. Mom reports baby is latching well. Her older children did not latch so she pumped for them. She would like to ebf for 3 months until she goes back to work then pump and offer formula as needed. Discussed baby behavior, feeding frequency, baby belly size, voids, wt loss, breast changes, and nipple care. Demonstrated manual expression, colostrum noted bilaterally, spoon in room. Given lactation handouts. Aware of OP services and support group. She will f/u with WIC on 08/28/15 and call for lactation as needed.       Maternal Data Formula Feeding for Exclusion: No Has patient been taught Hand Expression?: Yes Does the patient have breastfeeding experience prior to this delivery?: Yes  Feeding Feeding Type: Breast Fed Length of feed: 25 min  LATCH Score/Interventions Latch: Repeated attempts needed to sustain latch, nipple held in mouth throughout feeding, stimulation needed to elicit sucking reflex. Intervention(s): Adjust position  Audible Swallowing: None Intervention(s): Skin to skin  Type of Nipple: Everted at rest and after stimulation Intervention(s): No intervention needed  Comfort (Breast/Nipple): Soft / non-tender     Hold (Positioning): Assistance needed to correctly position infant at breast and maintain latch.  LATCH Score: 6  Lactation Tools Discussed/Used WIC Program: Yes   Consult Status Consult Status: Follow-up Date: 08/26/15 Follow-up type: In-patient    Rulon Eisenmenger 08/25/2015, 6:34 PM

## 2015-08-26 ENCOUNTER — Encounter (HOSPITAL_COMMUNITY)
Admission: AD | Disposition: A | Discharge status: Home / Self Care | Payer: Self-pay | Source: Ambulatory Visit | Attending: Obstetrics and Gynecology

## 2015-08-26 ENCOUNTER — Inpatient Hospital Stay (HOSPITAL_COMMUNITY): Payer: No Typology Code available for payment source | Admitting: Anesthesiology

## 2015-08-26 ENCOUNTER — Inpatient Hospital Stay (HOSPITAL_COMMUNITY): Admission: RE | Admit: 2015-08-26 | Payer: Worker's Compensation | Source: Ambulatory Visit

## 2015-08-26 DIAGNOSIS — Z302 Encounter for sterilization: Secondary | ICD-10-CM

## 2015-08-26 HISTORY — PX: TUBAL LIGATION: SHX77

## 2015-08-26 LAB — PLATELET COUNT: Platelets: 156 10*3/uL (ref 150–400)

## 2015-08-26 SURGERY — LIGATION, FALLOPIAN TUBE, POSTPARTUM
Anesthesia: Epidural | Site: Abdomen | Laterality: Bilateral

## 2015-08-26 MED ORDER — HYDROMORPHONE HCL 1 MG/ML IJ SOLN: 0.2500 mg | INTRAMUSCULAR | Status: DC | PRN

## 2015-08-26 MED ORDER — MIDAZOLAM HCL 2 MG/2ML IJ SOLN
INTRAMUSCULAR | Status: DC | PRN
  Administered 2015-08-26: 2 mg via INTRAVENOUS

## 2015-08-26 MED ORDER — ONDANSETRON HCL 4 MG/2ML IJ SOLN
INTRAMUSCULAR | Status: DC | PRN
  Administered 2015-08-26: 4 mg via INTRAVENOUS

## 2015-08-26 MED ORDER — OXYCODONE HCL 5 MG PO TABS: 5.0000 mg | ORAL_TABLET | Freq: Once | ORAL | Status: DC | PRN

## 2015-08-26 MED ORDER — BUPIVACAINE HCL (PF) 0.25 % IJ SOLN
INTRAMUSCULAR | Status: DC | PRN
  Administered 2015-08-26 (×2): 10 mL

## 2015-08-26 MED ORDER — LIDOCAINE-EPINEPHRINE (PF) 2 %-1:200000 IJ SOLN
INTRAMUSCULAR | Status: AC
  Filled 2015-08-26: qty 20

## 2015-08-26 MED ORDER — MIDAZOLAM HCL 2 MG/2ML IJ SOLN
INTRAMUSCULAR | Status: AC
  Filled 2015-08-26: qty 2

## 2015-08-26 MED ORDER — LIDOCAINE-EPINEPHRINE (PF) 2 %-1:200000 IJ SOLN
INTRAMUSCULAR | Status: DC | PRN
  Administered 2015-08-26: 5 mL via INTRADERMAL
  Administered 2015-08-26 (×2): 3 mL via INTRADERMAL
  Administered 2015-08-26: 7 mL via INTRADERMAL
  Administered 2015-08-26: 2 mL via INTRADERMAL

## 2015-08-26 MED ORDER — BUPIVACAINE HCL (PF) 0.25 % IJ SOLN
INTRAMUSCULAR | Status: AC
  Filled 2015-08-26: qty 30

## 2015-08-26 MED ORDER — LACTATED RINGERS IV SOLN
INTRAVENOUS | Status: DC | PRN
  Administered 2015-08-26: 09:00:00 via INTRAVENOUS

## 2015-08-26 MED ORDER — MEPERIDINE HCL 25 MG/ML IJ SOLN: 6.2500 mg | INTRAMUSCULAR | Status: DC | PRN

## 2015-08-26 MED ORDER — OXYCODONE HCL 5 MG/5ML PO SOLN: 5.0000 mg | Freq: Once | ORAL | Status: DC | PRN

## 2015-08-26 SURGICAL SUPPLY — 22 items
BLADE SURG 11 STRL SS (BLADE) ×3 IMPLANT
CLIP FILSHIE TUBAL LIGA STRL (Clip) ×3 IMPLANT
CLOTH BEACON ORANGE TIMEOUT ST (SAFETY) ×3 IMPLANT
DRSG OPSITE POSTOP 3X4 (GAUZE/BANDAGES/DRESSINGS) ×3 IMPLANT
DURAPREP 26ML APPLICATOR (WOUND CARE) ×3 IMPLANT
GLOVE BIOGEL PI IND STRL 7.0 (GLOVE) ×1 IMPLANT
GLOVE BIOGEL PI IND STRL 7.5 (GLOVE) ×1 IMPLANT
GLOVE BIOGEL PI INDICATOR 7.0 (GLOVE) ×2
GLOVE BIOGEL PI INDICATOR 7.5 (GLOVE) ×2
GLOVE ECLIPSE 7.5 STRL STRAW (GLOVE) ×3 IMPLANT
GOWN STRL REUS W/TWL LRG LVL3 (GOWN DISPOSABLE) ×6 IMPLANT
NEEDLE HYPO 22GX1.5 SAFETY (NEEDLE) ×3 IMPLANT
NS IRRIG 1000ML POUR BTL (IV SOLUTION) ×3 IMPLANT
PACK ABDOMINAL MINOR (CUSTOM PROCEDURE TRAY) ×3 IMPLANT
PROTECTOR NERVE ULNAR (MISCELLANEOUS) ×6 IMPLANT
SPONGE LAP 4X18 X RAY DECT (DISPOSABLE) ×2 IMPLANT
SUT VICRYL 0 UR6 27IN ABS (SUTURE) ×3 IMPLANT
SUT VICRYL 4-0 PS2 18IN ABS (SUTURE) ×3 IMPLANT
SYR CONTROL 10ML LL (SYRINGE) ×3 IMPLANT
TOWEL OR 17X24 6PK STRL BLUE (TOWEL DISPOSABLE) ×6 IMPLANT
TRAY FOLEY CATH SILVER 14FR (SET/KITS/TRAYS/PACK) ×3 IMPLANT
WATER STERILE IRR 1000ML POUR (IV SOLUTION) ×3 IMPLANT

## 2015-08-26 NOTE — Op Note (Signed)
Jeanne Hammond 08/24/2015 - 08/26/2015  PREOPERATIVE DIAGNOSIS:  Undesired fertility  POSTOPERATIVE DIAGNOSIS:  Undesired fertility  PROCEDURE:  Postpartum Bilateral Tubal Sterilization using Filshie Clips   SURGEON:  Dr Candelaria Celeste  ASSISTANT: Dr Jen Mow  ANESTHESIA:  Epidural  COMPLICATIONS:  None immediate.  ESTIMATED BLOOD LOSS:  Less than 20cc.  FLUIDS: 700 cc LR.  URINE OUTPUT:  250 cc of clear urine.  INDICATIONS: 35 y.o. yo [id number]  with undesired fertility,status post vaginal delivery, desires permanent sterilization. Risks and benefits of procedure discussed with patient including permanence of method, bleeding, infection, injury to surrounding organs and need for additional procedures. Risk failure of 0.5-1% with increased risk of ectopic gestation if pregnancy occurs was also discussed with patient.   FINDINGS:  Normal uterus, tubes, and ovaries.  TECHNIQUE:  The patient was taken to the operating room where her epidural anesthesia was dosed up to surgical level and found to be adequate.  She was then placed in the dorsal supine position and prepped and draped in sterile fashion.  After an adequate timeout was performed, attention was turned to the patient's abdomen where a small transverse skin incision was made under the umbilical fold. The incision was taken down to the layer of fascia using the scalpel, and fascia was incised, and extended bilaterally using Mayo scissors. The peritoneum was entered in a sharp fashion. Attention was then turned to the patient's uterus, and left fallopian tube was identified and followed out to the fimbriated end.  A Filshie clip was placed on the left fallopian tube about 2 cm from the cornual attachment, with care given to incorporate the underlying mesosalpinx.  A similar process was carried out on the rightl side allowing for bilateral tubal sterilization.  Good hemostasis was noted overall.  Local analgesia was drizzled on both  operative sites.The instruments were then removed from the patient's abdomen and the fascial incision was repaired with 0 Vicryl, 10 cc of 0.5% Marcaine was injected subcutaneously and the skin was closed with a 3-0 Monocryl subcuticular stitch. The patient tolerated the procedure well.  Sponge, lap, and needle counts were correct times two.  The patient was then taken to the recovery room awake, extubated and in stable condition.   [address] [place], [place] 08/26/2015 10:20 AM

## 2015-08-26 NOTE — Progress Notes (Signed)
Post Partum Day 1 Subjective: no complaints, up ad lib, voiding and tolerating PO  Objective: Blood pressure (!) 92/58, pulse 68, temperature 97.7 F (36.5 C), resp. rate 18, height 5\' 5"  (1.651 m), weight 153 lb (69.4 kg), SpO2 99 %, unknown if currently breastfeeding.  Physical Exam:  General: alert, cooperative and no distress Lochia: appropriate Uterine Fundus: firm DVT Evaluation: No evidence of DVT seen on physical exam. Negative Homan's sign. No cords or calf tenderness.   Recent Labs  08/24/15 1045  HGB 12.8  HCT 37.6    Assessment/Plan: Breastfeeding Risks of procedure discussed with patient including but not limited to: risk of regret, permanence of method, bleeding, infection, injury to surrounding organs and need for additional procedures.  Failure risk of 1 -2 % with increased risk of ectopic gestation if pregnancy occurs was also discussed with patient.    Jeanne Heritage, DO 08/26/2015 8:55 AM     LOS: 2 days   Jeanne Hammond Jeanne Hammond 08/26/2015, 8:55 AM

## 2015-08-26 NOTE — Anesthesia Postprocedure Evaluation (Signed)
Anesthesia Post Note  Patient: Jeanne Hammond  Procedure(s) Performed: Procedure(s) (LRB): POST PARTUM TUBAL LIGATION (Bilateral)  Patient location during evaluation: PACU Anesthesia Type: Epidural Level of consciousness: awake and alert Pain management: satisfactory to patient Vital Signs Assessment: post-procedure vital signs reviewed and stable Respiratory status: nonlabored ventilation Cardiovascular status: blood pressure returned to baseline and stable Postop Assessment: epidural receding, no backache, no headache and no signs of nausea or vomiting Anesthetic complications: no     Last Vitals:  Vitals:   08/26/15 1115 08/26/15 1130  BP: (!) 91/57 (!) 90/56  Pulse: 66 67  Resp: 11 12  Temp:      Last Pain:  Vitals:   08/26/15 0853  TempSrc: Oral  PainSc:    Pain Goal: Patients Stated Pain Goal:  (pt doesnt need anything at this time for pain) (08/25/15 0737)               [name] A

## 2015-08-26 NOTE — Anesthesia Preprocedure Evaluation (Signed)
Anesthesia Evaluation  Patient identified by MRN, date of birth, ID band Patient awake    Reviewed: Allergy & Precautions, NPO status , Patient's Chart, lab work & pertinent test results  History of Anesthesia Complications (+) MALIGNANT HYPERTHERMIA  Airway Mallampati: I  TM Distance: >3 FB     Dental  (+) Teeth Intact   Pulmonary    breath sounds clear to auscultation       Cardiovascular  Rhythm:Regular Rate:Normal     Neuro/Psych    GI/Hepatic   Endo/Other    Renal/GU      Musculoskeletal   Abdominal   Peds  Hematology   Anesthesia Other Findings Post partum with no complications.  Reproductive/Obstetrics                            Anesthesia Physical Anesthesia Plan  ASA: II  Anesthesia Plan: Epidural   Post-op Pain Management:    Induction:   Airway Management Planned: Simple Face Mask  Additional Equipment:   Intra-op Plan:   Post-operative Plan:   Informed Consent: I have reviewed the patients History and Physical, chart, labs and discussed the procedure including the risks, benefits and alternatives for the proposed anesthesia with the patient or authorized representative who has indicated his/her understanding and acceptance.     Plan Discussed with: Anesthesiologist, CRNA and Surgeon  Anesthesia Plan Comments:         Anesthesia Quick Evaluation

## 2015-08-26 NOTE — Lactation Note (Addendum)
This note was copied from a baby's chart. Lactation Consultation Note  Patient Name: Jeanne Hammond [id number] Date: 08/26/2015 Reason for consult: Follow-up assessment [facility] interpreter used. Baby at 29 hr of life. Mom reports baby is latching well but she does not think he is getting enough milk. She is putting him to breast when he cries, he will fall asleep at the breast, she will lay him down, and he will cry again. Discussed normal baby behavior, feeding frequency, baby belly size, voids, wt loss, breast changes, and nipple care. She does know how to manually express and has spoon in the room. Encouraged her to spoon feed him if she is really worried, but so far there are no signs that she has low supply. She had questions about giving a "taste of formula" when she gets home. She is aware of lactation services and support group. She has a [facility] apt on 08/28/15. She will call as needed.     Maternal Data    Feeding Feeding Type: Breast Fed Length of feed: 20 min  LATCH Score/Interventions Latch: Grasps breast easily, tongue down, lips flanged, rhythmical sucking. Intervention(s): Adjust position;Assist with latch;Breast massage;Breast compression  Audible Swallowing: Spontaneous and intermittent Intervention(s): Skin to skin;Hand expression Intervention(s): Alternate breast massage  Type of Nipple: Everted at rest and after stimulation  Comfort (Breast/Nipple): Soft / non-tender     Hold (Positioning): Assistance needed to correctly position infant at breast and maintain latch. Intervention(s): Breastfeeding basics reviewed;Support Pillows;Position options;Skin to skin  LATCH Score: 9  Lactation Tools Discussed/Used     Consult Status Consult Status: Follow-up Date: 08/27/15 Follow-up type: In-patient    Jeanne Hammond 08/26/2015, 7:06 PM

## 2015-08-26 NOTE — Addendum Note (Signed)
Addendum  created 08/26/15 1622 by Angela Adam, CRNA   Charge Capture section accepted

## 2015-08-26 NOTE — Addendum Note (Signed)
Addendum  created 08/26/15 1829 by Angela Adam, CRNA   Sign clinical note

## 2015-08-26 NOTE — Progress Notes (Signed)
Post Partum Day 1  Subjective:  Jeanne Hammond is a 35 y.o. G3P3003 [redacted]w[redacted]d s/p SVD after IOL for FHR variables.  No acute events overnight.  Pt denies problems with ambulating, voiding or po intake.  She denies nausea or vomiting.  Pain is well controlled.  She has had flatus. She has not had bowel movement.  Lochia Minimal.  Plan for birth control is bilateral tubal ligation.  Method of Feeding: Breast  Objective: BP (!) 92/58   Pulse 68   Temp 97.7 F (36.5 C)   Resp 18   Ht 5\' 5"  (1.651 m)   Wt 69.4 kg (153 lb)   SpO2 99%   Breastfeeding? Unknown   BMI 25.46 kg/m   Physical Exam:  General: alert, cooperative and no distress Lochia:normal flow Chest: CTAB  Heart: RRR no m/r/g Abdomen: soft, nontender, fundus firm at/below umbilicus DVT Evaluation: No evidence of DVT seen on physical exam. Extremities: no edema   Recent Labs  08/24/15 1045  HGB 12.8  HCT 37.6    Assessment/Plan:  ASSESSMENT: Jeanne Hammond is a 35 y.o. G3P3003 [redacted]w[redacted]d ppd #1 s/p NSVD doing well.   Patient is stable overnight BTL scheduled for 9:00 am    LOS: 2 days   Bayard Hugger 08/26/2015, 7:41 AM

## 2015-08-26 NOTE — Anesthesia Postprocedure Evaluation (Signed)
Anesthesia Post Note  Patient: Kiahra Seybert  Procedure(s) Performed: Procedure(s) (LRB): POST PARTUM TUBAL LIGATION (Bilateral)  Patient location during evaluation: Mother Baby Anesthesia Type: Epidural Level of consciousness: awake and alert, oriented and patient cooperative Pain management: pain level controlled Vital Signs Assessment: post-procedure vital signs reviewed and stable Respiratory status: spontaneous breathing Cardiovascular status: stable Postop Assessment: no headache, epidural receding, patient able to bend at knees and no signs of nausea or vomiting Anesthetic complications: no Comments: Pain score 3; just received pain medication.     Last Vitals:  Vitals:   08/26/15 1300 08/26/15 1638  BP: 107/63 124/68  Pulse: 74 85  Resp: 18 18  Temp: 36.7 C 36.7 C    Last Pain:  Vitals:   08/26/15 1638  TempSrc: Oral  PainSc:    Pain Goal: Patients Stated Pain Goal: 5 (08/26/15 1300)               Merrilyn Puma

## 2015-08-26 NOTE — Transfer of Care (Signed)
Immediate Anesthesia Transfer of Care Note  Patient: Jeanne Hammond  Procedure(s) Performed: Procedure(s): POST PARTUM TUBAL LIGATION (Bilateral)  Patient Location: PACU  Anesthesia Type:Epidural  Level of Consciousness: awake and alert   Airway & Oxygen Therapy: Patient Spontanous Breathing and Patient connected to nasal cannula oxygen  Post-op Assessment: Report given to RN and Post -op Vital signs reviewed and stable  Post vital signs: Reviewed and stable  Last Vitals:  Vitals:   08/26/15 0605 08/26/15 0853  BP: (!) 92/58 (!) 108/57  Pulse: 68 80  Resp: 18 17  Temp: 36.5 C 36.4 C    Last Pain:  Vitals:   08/26/15 0853  TempSrc: Oral  PainSc:       Patients Stated Pain Goal:  (pt doesnt need anything at this time for pain) (08/25/15 0737)  Complications: No apparent anesthesia complications

## 2015-08-27 ENCOUNTER — Ambulatory Visit: Payer: Self-pay

## 2015-08-27 MED ORDER — IBUPROFEN 600 MG PO TABS: 600.0000 mg | ORAL_TABLET | Freq: Four times a day (QID) | ORAL | 0 refills | Status: AC

## 2015-08-27 MED ORDER — OXYCODONE-ACETAMINOPHEN 5-325 MG PO TABS: 1.0000 | ORAL_TABLET | ORAL | 0 refills | Status: AC | PRN

## 2015-08-27 NOTE — Discharge Summary (Signed)
        OB Discharge Summary  Patient Name: Jeanne Hammond DOB: 1980/03/06 MRN: [id number]  Date of admission: 08/24/2015 Delivering MD: Andres Ege R   Date of discharge: 08/27/2015  Admitting diagnosis: 40WKS,LABOR desires sterilization  desires sterilization Intrauterine pregnancy: [redacted]w[redacted]d     Secondary diagnosis:Active Problems:   Variable fetal heart rate decelerations, antepartum  Additional problems:none     Discharge diagnosis: Term Pregnancy Delivered                                                                     Post partum procedures:postpartum tubal ligation  Augmentation: n/a  Complications: None  Hospital course:  Onset of Labor With Vaginal Delivery     35 y.o. yo G3P3003 at [redacted]w[redacted]d was admitted in Active Labor on 08/24/2015. Patient had an uncomplicated labor course as follows:  Membrane Rupture Time/Date: 11:51 AM ,08/25/2015   Intrapartum Procedures: Episiotomy:                                           Lacerations:     Patient had a delivery of a Viable infant. 08/25/2015  Information for the patient's newborn:  Myrth, Shann [[id number]]  Delivery Method: Vaginal, Spontaneous Delivery (Filed from Delivery Summary)    Pateint had an uncomplicated postpartum course.  She is ambulating, tolerating a regular diet, passing flatus, and urinating well. Patient is discharged home in stable condition on 08/27/15.    Physical exam Vitals:   08/26/15 1638 08/26/15 2110 08/27/15 0100 08/27/15 0510  BP: 124/68 (!) 100/59 104/62 96/62  Pulse: 85 78 80 66  Resp: 18 18 18 16   Temp: 98 F (36.7 C) 97.6 F (36.4 C)  98 F (36.7 C)  TempSrc: Oral Oral  Oral  SpO2:  98%    Weight:      Height:       General: alert, cooperative and no distress Lochia: appropriate Uterine Fundus: firm Incision: Healing well with no significant drainage, No significant erythema, Dressing is clean, dry, and intact DVT Evaluation: No evidence of DVT seen on physical exam. Negative  Homan's sign. Labs: Lab Results  Component Value Date   WBC 9.3 08/24/2015   HGB 12.8 08/24/2015   HCT 37.6 08/24/2015   MCV 87.9 08/24/2015   PLT 156 08/26/2015   No flowsheet data found.  Discharge instruction: per After Visit Summary and "Baby and Me Booklet".  After Visit Meds:    Medication List    ASK your doctor about these medications   prenatal multivitamin Tabs tablet Take 1 tablet by mouth daily at 12 noon.       Diet: routine diet  Activity: Advance as tolerated. Pelvic rest for 6 weeks.   Outpatient follow up:6 weeks Follow up Appt:No future appointments. Follow up visit: No Follow-up on file.  Postpartum contraception: Tubal Ligation  Newborn Data: Live born female  Birth Weight: 7 lb 13 oz (3544 g) APGAR: 9, 9  Baby Feeding: Bottle and Breast Disposition:home with mother   08/27/2015 Wyvonnia Dusky, CNM

## 2015-08-27 NOTE — Lactation Note (Signed)
This note was copied from a baby's chart. Lactation Consultation Note Follow up assessment with this mom and term baby, now 98 hours old, and trying to latch, but on and off, fussy. On exam of his mouth, his lip frenulum extends to the gum line, and his lingual frenulum if short, thick and posterior. His tongue cups with elevation, and  Does not extend past his gum line. Mom denies pain with latching, but the baby would not latch without 20 nipple shileld, filled with formula. He was then able to latch deeply, with strong suckles , but no swallows.  I felt the 20 was too tight on mom, so I fitted her with a 24, and mom will try this with next hunger cue. The baby was supplemented with formula, but he was not able to make a seal for feed without gentle chin and cheek support. He did take an ounce, and tolerated this well. I started mom pumping every 3 hours, with DEP, in initiation setting. Mom has easily expressed colostrum, and had positive breast changes during her pregnancy. Mom is 68, has a 79 year old and an 57 year old. This is her first time breastfeeding. Mom knows to call for questions/concerns.   Patient Name: Jeanne Hammond [id number] Date: 08/27/2015 Reason for consult: Follow-up assessment   Maternal Data    Feeding Feeding Type: Bottle Fed - Formula Nipple Type: Slow - flow Length of feed: 8 min  LATCH Score/Interventions Latch: Grasps breast easily, tongue down, lips flanged, rhythmical sucking. (with 20 nipple shiled with formula placed in shield with curved tip syringe) Intervention(s): Adjust position;Assist with latch  Audible Swallowing: None Intervention(s): Skin to skin;Hand expression Intervention(s): Alternate breast massage  Type of Nipple: Everted at rest and after stimulation Intervention(s): Double electric pump  Comfort (Breast/Nipple): Soft / non-tender     Hold (Positioning): Assistance needed to correctly position infant at breast and maintain  latch. Intervention(s): Breastfeeding basics reviewed;Support Pillows;Position options;Skin to skin  LATCH Score: 7  Lactation Tools Discussed/Used Tools: Nipple Shields Nipple shield size: 20;24;Other (comment) (baby did well with 20, but 24 fits mom betterr) Pump Review: Setup, frequency, and cleaning;Milk Storage;Other (comment) Initiated by:: Danton Clap RN IBCLC Date initiated:: 08/27/15   Consult Status Consult Status: Follow-up Date: 08/28/15 Follow-up type: In-patient    Alfred Levins 08/27/2015, 1:05 PM

## 2015-08-28 ENCOUNTER — Ambulatory Visit: Payer: Self-pay

## 2015-08-28 NOTE — Lactation Note (Signed)
This note was copied from a baby's chart. Lactation Consultation Note  Patient Name: Jeanne Hammond [id number] Date: 08/28/2015 Reason for consult: Follow-up assessment;Difficult latch Video interpreter used for visit - Jill Side #[id number] - Mom reports sometimes baby will latch with or without the nipple shield and sometimes he is too fussy so she just gives supplement. At this visit, Mom latching baby in cradle hold to right breast, baby will take few suckles then come off the breast very fussy. Mom changed to left breast and LC had Mom use #24 nipple shield, Baby too fussy to latch, pre-filled nipple shield with formula using curved tipped syringe, baby took few suckles then came off the breast fussy again. Demonstrated 5 fr feeding tube/syringe at breast but baby would not take this either. Mom reports she does try with each feeding to latch, if he will sustain the latch it is usually for 15 minutes then she will supplement.  Finger fed the baby the remaining formula in the 5 fr feeding tube/syringe - LC notes baby does not have good suckling rhythm, lots of chewing.  Mom plans to get DEBP from [facility] but does not have appointment. Called [facility] office and left message for counselor to call Mom about DEBP. Mom does not want [facility] loaner she reports she will use manual pump. LC advised Mom she needs to pump every 3 hours for 15 - 20 minutes to encourage milk production, prevent engorgement and protect milk supply. Continue to try and latch baby with each feeding, supplement each feeding till her milk supply comes in. Guidelines for supplement with/without BF reviewed with and given to Mom. Mom will continue to use bottle to supplement. Advised to change formula to Similac Newborn instead of Alimentum.  Engorgement care reviewed if needed - refer to Baby N Me booklet, page 24 Breast Milk storage guidelines discussed - refer to Baby N Me booklet, page 25. Offered OP f/u, Mom will call if desires. Nipple shield care  form given to Mom, advised to sterilize QD. Mom to call for questions/concerns.    Maternal Data    Feeding Feeding Type: Formula Nipple Type: Slow - flow Length of feed: 5 min  LATCH Score/Interventions Latch: Repeated attempts needed to sustain latch, nipple held in mouth throughout feeding, stimulation needed to elicit sucking reflex. Intervention(s): Adjust position;Assist with latch  Audible Swallowing: A few with stimulation (w/supplement at breast)  Type of Nipple: Everted at rest and after stimulation Intervention(s): Hand pump;Double electric pump  Comfort (Breast/Nipple): Soft / non-tender     Hold (Positioning): Assistance needed to correctly position infant at breast and maintain latch. Intervention(s): Breastfeeding basics reviewed;Support Pillows;Position options;Skin to skin  LATCH Score: 7  Lactation Tools Discussed/Used Tools: Nipple Dorris Carnes;Pump Nipple shield size: 24 Breast pump type: Double-Electric Breast Pump   Consult Status Date: 08/28/15 Follow-up type: In-patient    Alfred Levins 08/28/2015, 11:46 AM

## 2015-08-29 ENCOUNTER — Ambulatory Visit: Payer: Self-pay

## 2015-08-29 NOTE — Lactation Note (Signed)
This note was copied from a baby's chart. Lactation Consultation Note  Patient Name: Jeanne Hammond [id number] Date: 08/29/2015 Reason for consult: Follow-up assessment Mom reports following feeding plan discussed yesterday. Happy with plan. Going to get DEBP from [facility] at 1300 today. Offering breast with feeding. Encouraged to call for questions/concerns.  Maternal Data    Feeding    LATCH Score/Interventions                      Lactation Tools Discussed/Used     Consult Status Consult Status: Complete Date: 08/29/15 Follow-up type: In-patient    Alfred Levins 08/29/2015, 10:49 AM

## 2015-08-31 ENCOUNTER — Encounter (HOSPITAL_COMMUNITY): Payer: Self-pay | Admitting: Family Medicine

## 2015-08-31 NOTE — Anesthesia Postprocedure Evaluation (Signed)
Anesthesia Post Note  Patient: Jeanne Hammond  Procedure(s) Performed: * No procedures listed *  Patient location during evaluation: Mother Baby Anesthesia Type: Epidural Level of consciousness: awake and alert Pain management: pain level controlled Vital Signs Assessment: post-procedure vital signs reviewed and stable Respiratory status: spontaneous breathing, nonlabored ventilation and respiratory function stable Cardiovascular status: stable Postop Assessment: no headache, no backache and epidural receding Anesthetic complications: no    Last Vitals: There were no vitals filed for this visit.  Last Pain: There were no vitals filed for this visit.               Jiles Garter
